# Patient Record
Sex: Female | Born: 1960 | Race: Black or African American | Hispanic: No | Marital: Married
Health system: Southern US, Community
[De-identification: ages and names within clinical notes are randomized; demographics above are authoritative.]

## PROBLEM LIST (undated history)

## (undated) DIAGNOSIS — R7303 Prediabetes: Secondary | ICD-10-CM

## (undated) DIAGNOSIS — N83202 Unspecified ovarian cyst, left side: Secondary | ICD-10-CM

## (undated) DIAGNOSIS — I10 Essential (primary) hypertension: Secondary | ICD-10-CM

---

## 2005-07-23 ENCOUNTER — Ambulatory Visit: Payer: Self-pay

## 2006-07-27 ENCOUNTER — Ambulatory Visit: Payer: Self-pay

## 2007-11-09 ENCOUNTER — Ambulatory Visit: Payer: Self-pay

## 2008-02-14 ENCOUNTER — Ambulatory Visit: Payer: Self-pay | Admitting: Family Medicine

## 2008-11-28 ENCOUNTER — Ambulatory Visit: Payer: Self-pay | Admitting: Family Medicine

## 2009-01-11 ENCOUNTER — Ambulatory Visit: Payer: Self-pay | Admitting: Family Medicine

## 2009-08-16 ENCOUNTER — Ambulatory Visit: Payer: Self-pay | Admitting: Gastroenterology

## 2010-05-21 ENCOUNTER — Ambulatory Visit: Payer: Self-pay | Admitting: Unknown Physician Specialty

## 2011-06-09 ENCOUNTER — Ambulatory Visit: Payer: Self-pay | Admitting: Unknown Physician Specialty

## 2012-06-09 ENCOUNTER — Ambulatory Visit: Payer: Self-pay | Admitting: Physician Assistant

## 2013-06-12 ENCOUNTER — Ambulatory Visit: Payer: Self-pay | Admitting: Physician Assistant

## 2014-07-26 ENCOUNTER — Ambulatory Visit: Payer: Self-pay | Admitting: Physician Assistant

## 2015-07-24 ENCOUNTER — Other Ambulatory Visit: Payer: Self-pay | Admitting: Physician Assistant

## 2015-07-24 DIAGNOSIS — Z1231 Encounter for screening mammogram for malignant neoplasm of breast: Secondary | ICD-10-CM

## 2015-08-08 ENCOUNTER — Ambulatory Visit
Admission: RE | Admit: 2015-08-08 | Discharge: 2015-08-08 | Disposition: A | Discharge status: Home / Self Care | Payer: Managed Care, Other (non HMO) | Source: Ambulatory Visit | Attending: Physician Assistant | Admitting: Physician Assistant

## 2015-08-08 DIAGNOSIS — Z1231 Encounter for screening mammogram for malignant neoplasm of breast: Secondary | ICD-10-CM | POA: Insufficient documentation

## 2016-07-20 ENCOUNTER — Other Ambulatory Visit: Payer: Self-pay | Admitting: Physician Assistant

## 2016-07-20 DIAGNOSIS — Z1231 Encounter for screening mammogram for malignant neoplasm of breast: Secondary | ICD-10-CM

## 2016-08-11 ENCOUNTER — Ambulatory Visit
Admission: RE | Admit: 2016-08-11 | Discharge: 2016-08-11 | Disposition: A | Discharge status: Home / Self Care | Payer: Managed Care, Other (non HMO) | Source: Ambulatory Visit | Attending: Physician Assistant | Admitting: Physician Assistant

## 2016-08-11 DIAGNOSIS — Z1231 Encounter for screening mammogram for malignant neoplasm of breast: Secondary | ICD-10-CM | POA: Diagnosis not present

## 2017-06-24 ENCOUNTER — Other Ambulatory Visit: Payer: Self-pay | Admitting: Physician Assistant

## 2017-06-24 DIAGNOSIS — Z1231 Encounter for screening mammogram for malignant neoplasm of breast: Secondary | ICD-10-CM

## 2017-08-13 ENCOUNTER — Inpatient Hospital Stay: Admission: RE | Admit: 2017-08-13 | Payer: Managed Care, Other (non HMO) | Source: Ambulatory Visit

## 2017-08-30 ENCOUNTER — Ambulatory Visit
Admission: RE | Admit: 2017-08-30 | Discharge: 2017-08-30 | Disposition: A | Discharge status: Home / Self Care | Payer: Managed Care, Other (non HMO) | Source: Ambulatory Visit | Attending: Physician Assistant | Admitting: Physician Assistant

## 2017-08-30 DIAGNOSIS — Z1231 Encounter for screening mammogram for malignant neoplasm of breast: Secondary | ICD-10-CM

## 2017-12-22 DIAGNOSIS — Z124 Encounter for screening for malignant neoplasm of cervix: Secondary | ICD-10-CM | POA: Diagnosis not present

## 2017-12-22 DIAGNOSIS — Z0000 Encounter for general adult medical examination without abnormal findings: Secondary | ICD-10-CM | POA: Diagnosis not present

## 2017-12-22 DIAGNOSIS — I10 Essential (primary) hypertension: Secondary | ICD-10-CM | POA: Diagnosis not present

## 2017-12-22 DIAGNOSIS — R7303 Prediabetes: Secondary | ICD-10-CM | POA: Diagnosis not present

## 2018-06-15 DIAGNOSIS — T783XXA Angioneurotic edema, initial encounter: Secondary | ICD-10-CM | POA: Diagnosis not present

## 2018-07-01 DIAGNOSIS — I10 Essential (primary) hypertension: Secondary | ICD-10-CM | POA: Diagnosis not present

## 2018-07-01 DIAGNOSIS — R7303 Prediabetes: Secondary | ICD-10-CM | POA: Diagnosis not present

## 2018-07-07 DIAGNOSIS — I10 Essential (primary) hypertension: Secondary | ICD-10-CM | POA: Diagnosis not present

## 2018-07-07 DIAGNOSIS — K5909 Other constipation: Secondary | ICD-10-CM | POA: Diagnosis not present

## 2018-07-07 DIAGNOSIS — R7303 Prediabetes: Secondary | ICD-10-CM | POA: Diagnosis not present

## 2018-07-26 ENCOUNTER — Other Ambulatory Visit: Payer: Self-pay | Admitting: Physician Assistant

## 2018-07-26 DIAGNOSIS — T783XXA Angioneurotic edema, initial encounter: Secondary | ICD-10-CM | POA: Diagnosis not present

## 2018-07-26 DIAGNOSIS — Z1231 Encounter for screening mammogram for malignant neoplasm of breast: Secondary | ICD-10-CM

## 2018-07-26 DIAGNOSIS — J301 Allergic rhinitis due to pollen: Secondary | ICD-10-CM | POA: Diagnosis not present

## 2018-07-26 DIAGNOSIS — J3089 Other allergic rhinitis: Secondary | ICD-10-CM | POA: Diagnosis not present

## 2018-09-09 ENCOUNTER — Ambulatory Visit
Admission: RE | Admit: 2018-09-09 | Discharge: 2018-09-09 | Disposition: A | Discharge status: Home / Self Care | Payer: 59 | Source: Ambulatory Visit | Attending: Physician Assistant | Admitting: Physician Assistant

## 2018-09-09 DIAGNOSIS — Z1231 Encounter for screening mammogram for malignant neoplasm of breast: Secondary | ICD-10-CM | POA: Insufficient documentation

## 2018-09-30 DIAGNOSIS — H40033 Anatomical narrow angle, bilateral: Secondary | ICD-10-CM | POA: Diagnosis not present

## 2018-10-03 DIAGNOSIS — H40033 Anatomical narrow angle, bilateral: Secondary | ICD-10-CM | POA: Diagnosis not present

## 2018-12-16 DIAGNOSIS — I10 Essential (primary) hypertension: Secondary | ICD-10-CM | POA: Diagnosis not present

## 2018-12-16 DIAGNOSIS — R7303 Prediabetes: Secondary | ICD-10-CM | POA: Diagnosis not present

## 2018-12-26 DIAGNOSIS — I10 Essential (primary) hypertension: Secondary | ICD-10-CM | POA: Diagnosis not present

## 2018-12-26 DIAGNOSIS — Z0000 Encounter for general adult medical examination without abnormal findings: Secondary | ICD-10-CM | POA: Diagnosis not present

## 2018-12-26 DIAGNOSIS — R7303 Prediabetes: Secondary | ICD-10-CM | POA: Diagnosis not present

## 2018-12-28 ENCOUNTER — Other Ambulatory Visit: Payer: Self-pay | Admitting: Physician Assistant

## 2018-12-28 DIAGNOSIS — R7989 Other specified abnormal findings of blood chemistry: Secondary | ICD-10-CM

## 2018-12-28 DIAGNOSIS — Z0000 Encounter for general adult medical examination without abnormal findings: Secondary | ICD-10-CM

## 2018-12-28 DIAGNOSIS — R945 Abnormal results of liver function studies: Principal | ICD-10-CM

## 2019-01-04 ENCOUNTER — Telehealth: Payer: Self-pay | Admitting: Physician Assistant

## 2019-01-05 ENCOUNTER — Ambulatory Visit: Payer: 59

## 2019-02-10 ENCOUNTER — Ambulatory Visit: Payer: 59

## 2019-02-15 ENCOUNTER — Ambulatory Visit
Admission: RE | Admit: 2019-02-15 | Discharge: 2019-02-15 | Disposition: A | Discharge status: Home / Self Care | Payer: 59 | Source: Ambulatory Visit | Attending: Physician Assistant | Admitting: Physician Assistant

## 2019-02-15 ENCOUNTER — Other Ambulatory Visit: Payer: Self-pay

## 2019-02-15 DIAGNOSIS — Z0000 Encounter for general adult medical examination without abnormal findings: Secondary | ICD-10-CM | POA: Diagnosis not present

## 2019-02-15 DIAGNOSIS — R945 Abnormal results of liver function studies: Secondary | ICD-10-CM | POA: Insufficient documentation

## 2019-02-15 DIAGNOSIS — R7989 Other specified abnormal findings of blood chemistry: Secondary | ICD-10-CM

## 2019-03-01 IMAGING — MG 2D DIGITAL SCREENING BILATERAL MAMMOGRAM WITH CAD AND ADJUNCT TO
9 of 13 series · 9 of 29 positions shown · non-contrast
Comparison: Previous exam(s).

CLINICAL DATA: Screening.

EXAM:
2D DIGITAL SCREENING BILATERAL MAMMOGRAM WITH CAD AND ADJUNCT TOMO

[R MLO (1 of 2)]
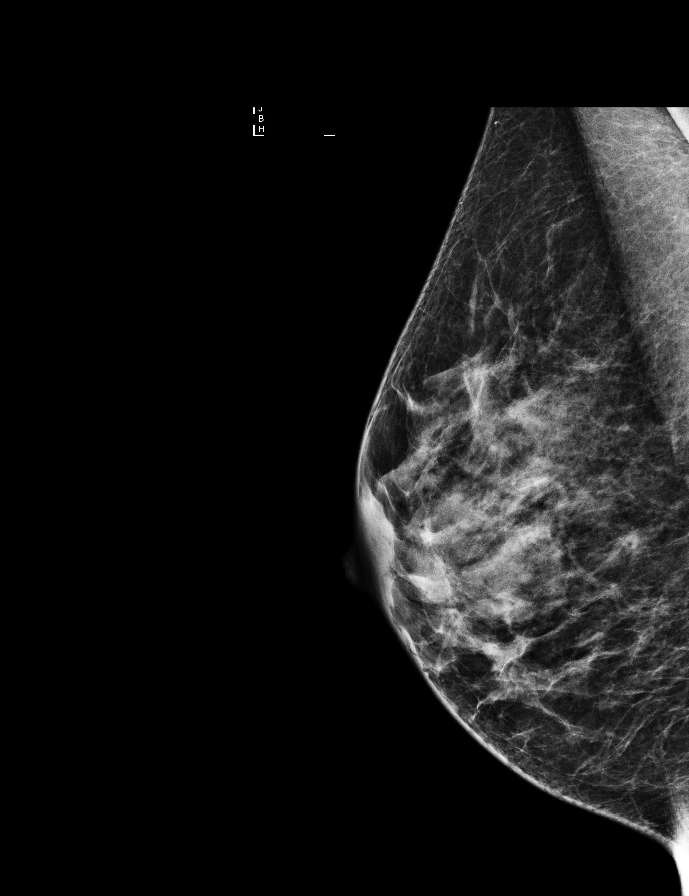

[R MLO (2 of 2)]
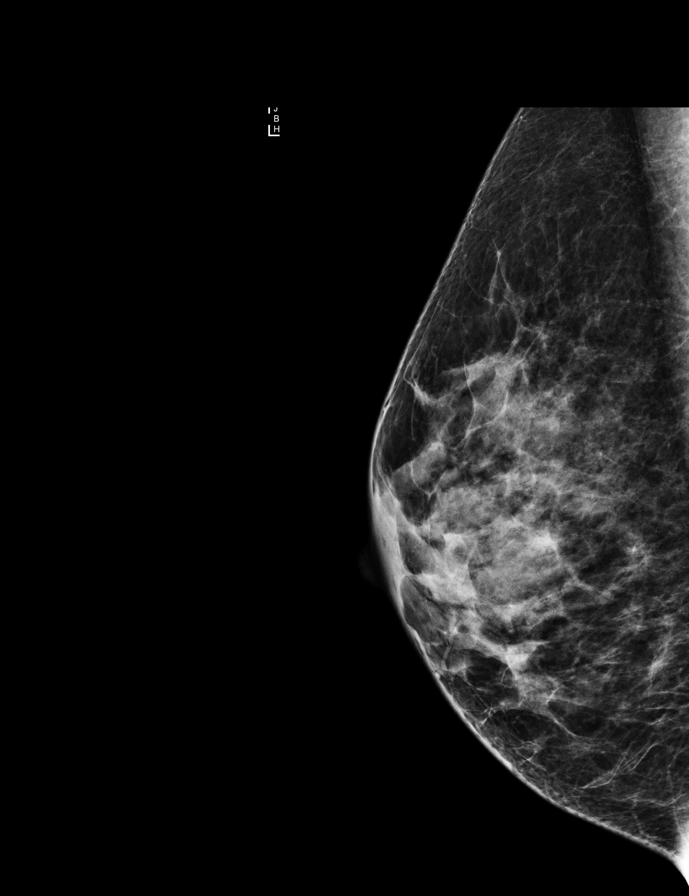

[L CC]
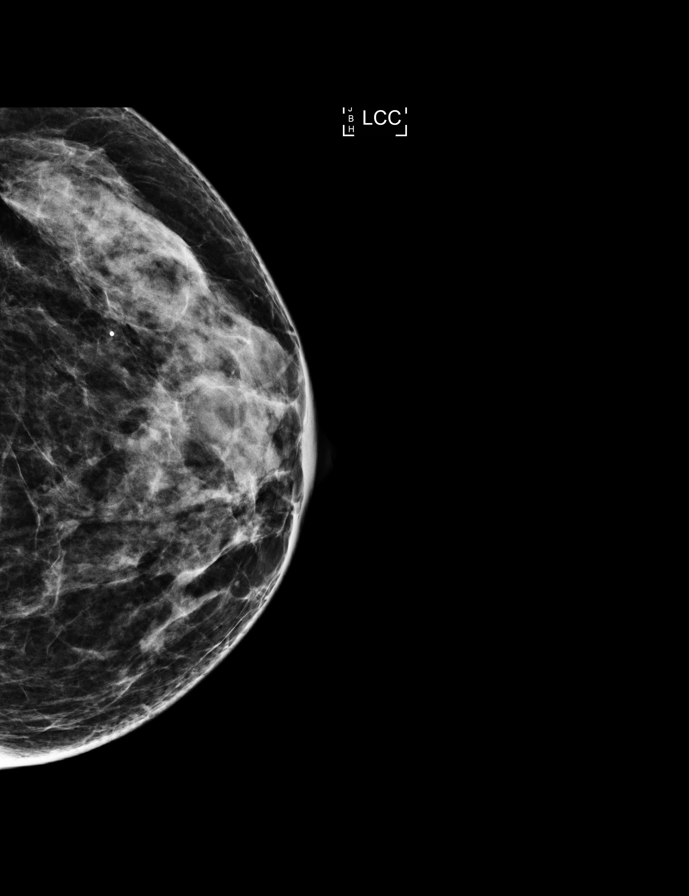

[R CC]
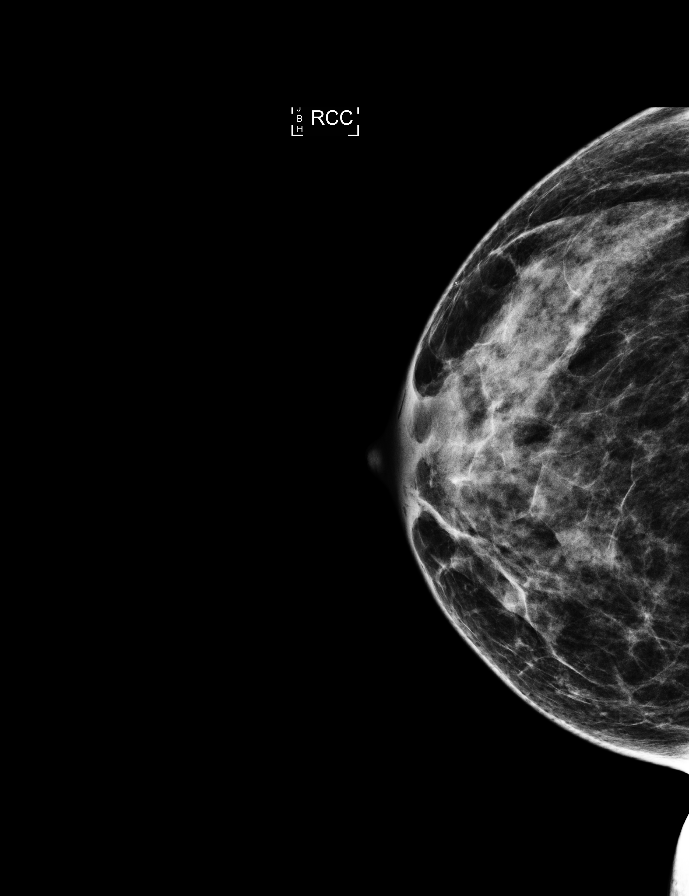

[R MLO synth-2D]
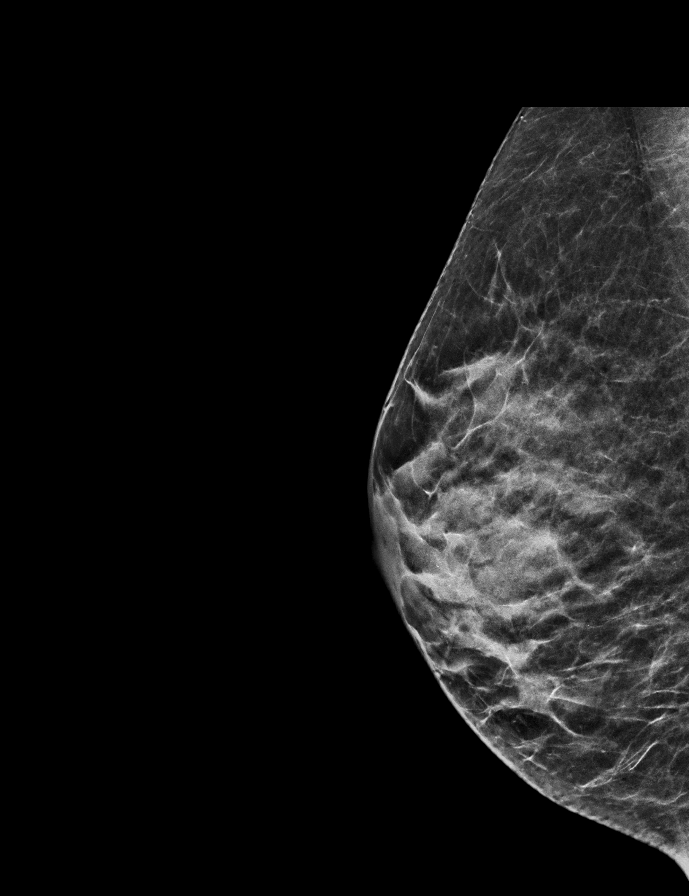

[L MLO synth-2D]
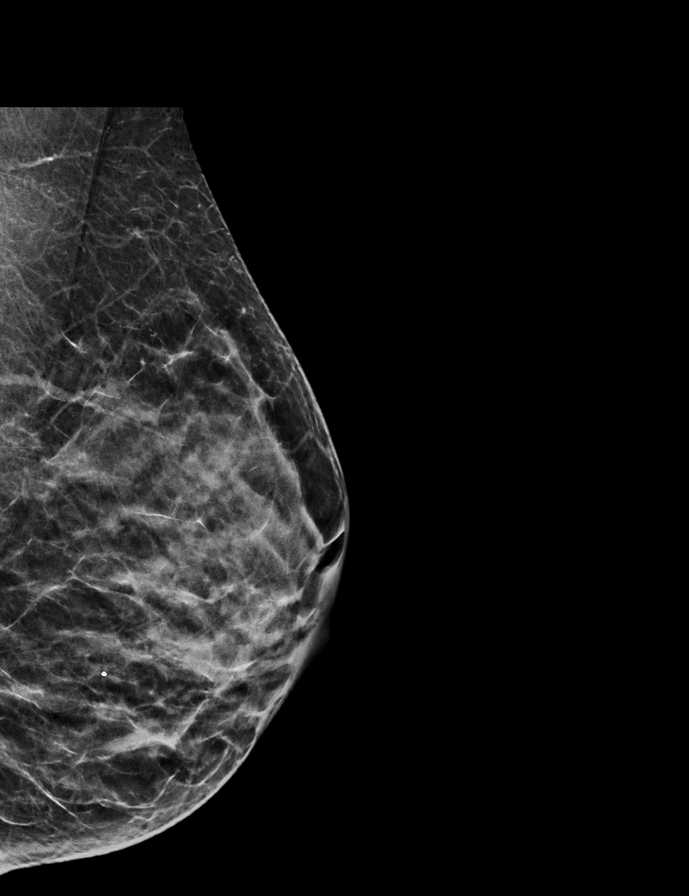

[L CC synth-2D]
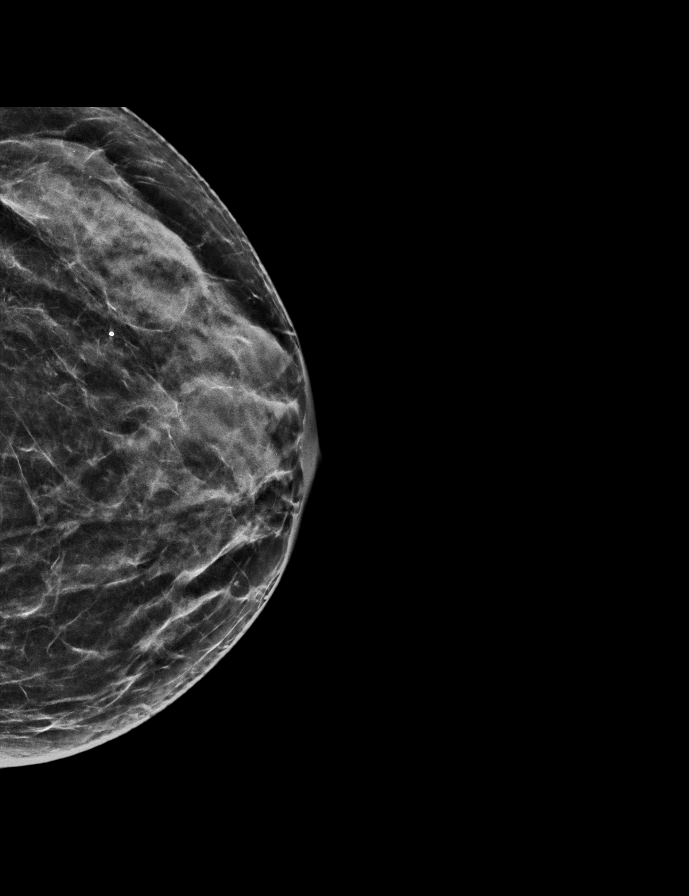

[L MLO]
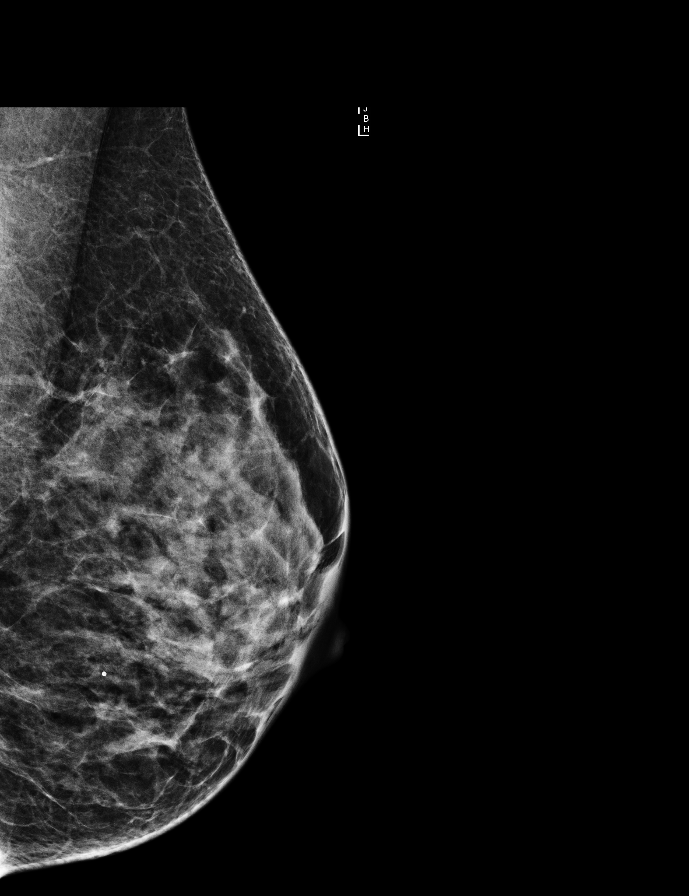

[R CC synth-2D]
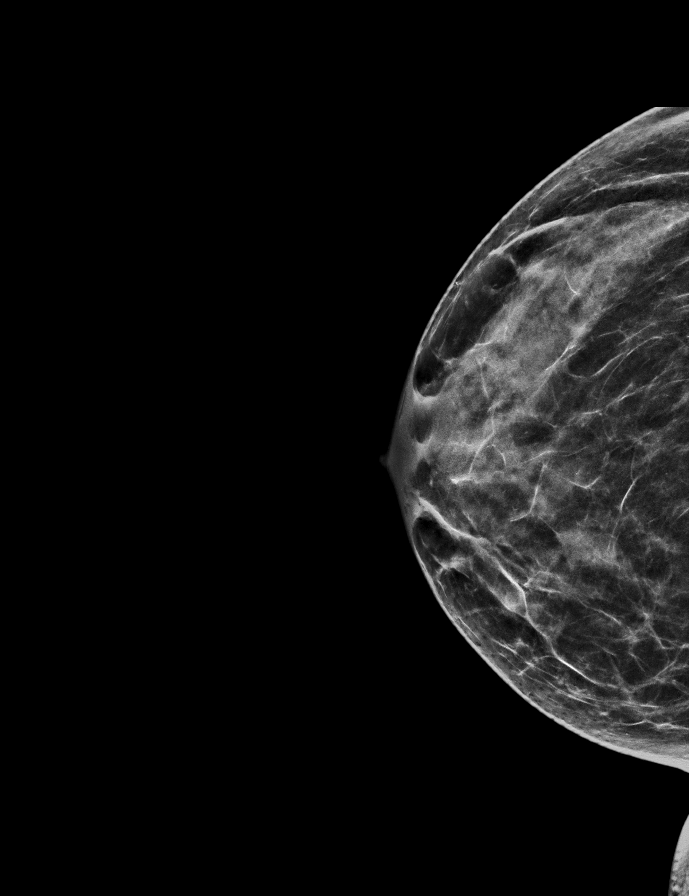

[9 of 29 positions shown; findings below may reference images not displayed]

ACR Breast Density Category c: The breast tissue is heterogeneously
dense, which may obscure small masses.
FINDINGS: There are no findings suspicious for malignancy. Images were
processed with CAD.
IMPRESSION: No mammographic evidence of malignancy. A result letter of this
screening mammogram will be mailed directly to the patient.

RECOMMENDATION:
Screening mammogram in one year. (Code:[id number])

BI-RADS CATEGORY  1: Negative.

## 2019-08-04 ENCOUNTER — Other Ambulatory Visit: Payer: Self-pay | Admitting: Physician Assistant

## 2019-08-04 DIAGNOSIS — Z1231 Encounter for screening mammogram for malignant neoplasm of breast: Secondary | ICD-10-CM

## 2019-09-12 ENCOUNTER — Ambulatory Visit
Admission: RE | Admit: 2019-09-12 | Discharge: 2019-09-12 | Disposition: A | Discharge status: Home / Self Care | Payer: 59 | Source: Ambulatory Visit | Attending: Physician Assistant | Admitting: Physician Assistant

## 2019-09-12 DIAGNOSIS — Z1231 Encounter for screening mammogram for malignant neoplasm of breast: Secondary | ICD-10-CM | POA: Diagnosis present

## 2020-06-26 ENCOUNTER — Other Ambulatory Visit: Payer: 59

## 2020-06-26 ENCOUNTER — Other Ambulatory Visit: Payer: Self-pay

## 2020-06-26 DIAGNOSIS — Z20822 Contact with and (suspected) exposure to covid-19: Secondary | ICD-10-CM

## 2020-06-28 ENCOUNTER — Telehealth: Payer: Self-pay

## 2020-06-28 LAB — SARS-COV-2, NAA 2 DAY TAT

## 2020-06-28 LAB — NOVEL CORONAVIRUS, NAA: SARS-CoV-2, NAA: NOT DETECTED

## 2020-06-28 NOTE — Telephone Encounter (Signed)
Patient called and she was informed that her test for COVID-19 was still pending result.  She verbalized understanding and will call back.

## 2020-06-30 ENCOUNTER — Telehealth: Payer: Self-pay | Admitting: Physician Assistant

## 2020-06-30 NOTE — Telephone Encounter (Signed)
Spoke with pt who was exposed to covid 19, nine days ago. It was an infant who exposed her. Pt is asymptomatic and "feels good". Pt tested negative for covid.  Pt asking if she can come out of quarantine. Advised pt  to monitor sx and get retested if having sx.  Advised pt to wash hands, wear mask and stay 6 feet apart and not touch her face. Pt verbalized understanding.

## 2020-06-30 NOTE — Telephone Encounter (Signed)
Pt was exposed to someone who tested positive for covid 19 about 8 or 9 day ago. Pt had covid test and result was neg. Pt would like to know if she needs to still in quarantine. Please call cell number [phone] if unable to reach on home phone

## 2020-07-26 ENCOUNTER — Other Ambulatory Visit: Payer: Self-pay | Admitting: Physician Assistant

## 2020-07-26 DIAGNOSIS — Z1231 Encounter for screening mammogram for malignant neoplasm of breast: Secondary | ICD-10-CM

## 2020-09-16 ENCOUNTER — Other Ambulatory Visit: Payer: Self-pay

## 2020-09-16 ENCOUNTER — Ambulatory Visit
Admission: RE | Admit: 2020-09-16 | Discharge: 2020-09-16 | Disposition: A | Discharge status: Home / Self Care | Payer: 59 | Source: Ambulatory Visit | Attending: Physician Assistant | Admitting: Physician Assistant

## 2020-09-16 DIAGNOSIS — Z1231 Encounter for screening mammogram for malignant neoplasm of breast: Secondary | ICD-10-CM | POA: Insufficient documentation

## 2021-08-04 ENCOUNTER — Other Ambulatory Visit: Payer: Self-pay | Admitting: Physician Assistant

## 2021-08-04 DIAGNOSIS — Z1231 Encounter for screening mammogram for malignant neoplasm of breast: Secondary | ICD-10-CM

## 2021-09-17 ENCOUNTER — Ambulatory Visit
Admission: RE | Admit: 2021-09-17 | Discharge: 2021-09-17 | Disposition: A | Discharge status: Home / Self Care | Payer: BC Managed Care – PPO | Source: Ambulatory Visit | Attending: Physician Assistant | Admitting: Physician Assistant

## 2021-09-17 ENCOUNTER — Other Ambulatory Visit: Payer: Self-pay

## 2021-09-17 DIAGNOSIS — Z1231 Encounter for screening mammogram for malignant neoplasm of breast: Secondary | ICD-10-CM | POA: Insufficient documentation

## 2021-09-25 ENCOUNTER — Emergency Department
Admission: EM | Admit: 2021-09-25 | Discharge: 2021-09-26 | Disposition: A | Discharge status: Home / Self Care | Payer: BC Managed Care – PPO | Attending: Emergency Medicine | Admitting: Emergency Medicine

## 2021-09-25 ENCOUNTER — Other Ambulatory Visit: Payer: Self-pay

## 2021-09-25 DIAGNOSIS — R310 Gross hematuria: Secondary | ICD-10-CM | POA: Insufficient documentation

## 2021-09-25 DIAGNOSIS — R109 Unspecified abdominal pain: Secondary | ICD-10-CM | POA: Insufficient documentation

## 2021-09-25 DIAGNOSIS — R8271 Bacteriuria: Secondary | ICD-10-CM | POA: Diagnosis not present

## 2021-09-25 DIAGNOSIS — I10 Essential (primary) hypertension: Secondary | ICD-10-CM | POA: Diagnosis not present

## 2021-09-25 HISTORY — DX: Essential (primary) hypertension: I10

## 2021-09-25 LAB — URINALYSIS, ROUTINE W REFLEX MICROSCOPIC
Bilirubin Urine: NEGATIVE
Glucose, UA: NEGATIVE mg/dL
Ketones, ur: NEGATIVE mg/dL
Leukocytes,Ua: NEGATIVE
Nitrite: NEGATIVE
Protein, ur: NEGATIVE mg/dL
Specific Gravity, Urine: <1.005 — ABNORMAL LOW (ref 1.005–1.030)
Squamous Epithelial / HPF: NONE SEEN (ref 0–5)
pH: 6.5 (ref 5.0–8.0)

## 2021-09-25 LAB — BASIC METABOLIC PANEL
Anion gap: 8 (ref 5–15)
BUN: 8 mg/dL (ref 8–23)
CO2: 27 mmol/L (ref 22–32)
Calcium: 9.7 mg/dL (ref 8.9–10.3)
Chloride: 104 mmol/L (ref 98–111)
Creatinine, Ser: 0.67 mg/dL (ref 0.44–1.00)
GFR, Estimated: >60 mL/min (ref >60)
Glucose, Bld: 119 mg/dL — ABNORMAL HIGH (ref 70–99)
Potassium: 3.8 mmol/L (ref 3.5–5.1)
Sodium: 139 mmol/L (ref 135–145)

## 2021-09-25 LAB — CBC
HCT: 38.8 % (ref 36.0–46.0)
Hemoglobin: 12.9 g/dL (ref 12.0–15.0)
MCH: 29.3 pg (ref 26.0–34.0)
MCHC: 33.2 g/dL (ref 30.0–36.0)
MCV: 88 fL (ref 80.0–100.0)
Platelets: 300 10*3/uL (ref 150–400)
RBC: 4.41 MIL/uL (ref 3.87–5.11)
RDW: 12.9 % (ref 11.5–15.5)
WBC: 7.1 10*3/uL (ref 4.0–10.5)
nRBC: 0 % (ref 0.0–0.2)

## 2021-09-25 NOTE — ED Triage Notes (Signed)
Pt states she noticed a couple hours ago that there was blood in her urine- pt denies in dysuria, but does have lower back pain- pt states there was a medium amount blood in the toilet

## 2021-09-25 NOTE — ED Provider Notes (Signed)
[facility] Emergency Department Provider Note  ____________________________________________   Event Date/Time   First MD Initiated Contact with Patient 09/25/21 2331     (approximate)  I have reviewed the triage vital signs and the nursing notes.   HISTORY  Chief Complaint Hematuria    HPI MARKELLA DAO is a 60 y.o. female here with hematuria.  The patient states that earlier this afternoon, she went to urinate and noticed that there was a small amount of blood in her urine.  She believes this came out of her urine and not vagina or bowel movement.  She states that since then, she has had 2 recurrent issues in which her urine has appeared bloody.  She denies any associated nausea or vomiting.  Denies any flank pain.  She does some mild lower back pain which is now resolved.  Denies history of UTIs, stones, or other issues similar to this.  No recent medication changes.  No recent falls or trauma.  No fever or chills.  No other complaints.    Past Medical History:  Diagnosis Date   Hypertension     There are no problems to display for this patient.   History reviewed. No pertinent surgical history.  Prior to Admission medications   Medication Sig Start Date End Date Taking? Authorizing Provider  cephALEXin (KEFLEX) 500 MG capsule Take 1 capsule (500 mg total) by mouth 3 (three) times daily for 5 days. 09/26/21 10/01/21 Yes Shaune Pollack, MD    Allergies Patient has no known allergies.  Family History  Problem Relation Age of Onset   Breast cancer Paternal Aunt    Breast cancer Cousin        distant maternal cousin    Social History    Review of Systems  Review of Systems  Constitutional:  Negative for fever.  HENT:  Negative for congestion and sore throat.   Eyes:  Negative for visual disturbance.  Respiratory:  Negative for cough and shortness of breath.   Cardiovascular:  Negative for chest pain.  Gastrointestinal:  Negative  for abdominal pain, diarrhea, nausea and vomiting.  Genitourinary:  Positive for hematuria. Negative for flank pain.  Musculoskeletal:  Negative for back pain (Mild, transient) and neck pain.  Skin:  Negative for rash and wound.  Neurological:  Negative for weakness.    ____________________________________________  PHYSICAL EXAM:      VITAL SIGNS: ED Triage Vitals  Enc Vitals Group     BP 09/25/21 1954 (!) 160/89     Pulse Rate 09/25/21 1954 80     Resp 09/25/21 1954 18     Temp 09/25/21 1954 98.2 F (36.8 C)     Temp Source 09/25/21 1954 Oral     SpO2 09/25/21 1954 100 %     Weight 09/25/21 1955 120 lb (54.4 kg)     Height 09/25/21 1955 5\' 1"  (1.549 m)     Head Circumference --      Peak Flow --      Pain Score 09/25/21 1955 3     Pain Loc --      Pain Edu? --      Excl. in GC? --      Physical Exam Vitals and nursing note reviewed.  Constitutional:      General: She is not in acute distress.    Appearance: She is well-developed.  HENT:     Head: Normocephalic and atraumatic.  Eyes:     Conjunctiva/sclera: Conjunctivae normal.  Cardiovascular:     Rate and Rhythm: Normal rate and regular rhythm.     Heart sounds: Normal heart sounds. No murmur heard.   No friction rub.  Pulmonary:     Effort: Pulmonary effort is normal. No respiratory distress.     Breath sounds: Normal breath sounds. No wheezing or rales.  Abdominal:     General: There is no distension.     Palpations: Abdomen is soft.     Tenderness: There is no abdominal tenderness.     Comments: Minimal tenderness of her right flank.  No overt CVA tenderness.  Musculoskeletal:     Cervical back: Neck supple.  Skin:    General: Skin is warm.     Capillary Refill: Capillary refill takes less than 2 seconds.  Neurological:     Mental Status: She is alert and oriented to person, place, and time.     Motor: No abnormal muscle tone.      ____________________________________________   LABS (all labs  ordered are listed, but only abnormal results are displayed)  Labs Reviewed  URINALYSIS, ROUTINE W REFLEX MICROSCOPIC - Abnormal; Notable for the following components:      Result Value   APPearance CLEAR (*)    Specific Gravity, Urine <1.005 (*)    Hgb urine dipstick LARGE (*)    Bacteria, UA RARE (*)    All other components within normal limits  BASIC METABOLIC PANEL - Abnormal; Notable for the following components:   Glucose, Bld 119 (*)    All other components within normal limits  CBC  CK    ____________________________________________  EKG:  ________________________________________  RADIOLOGY All imaging, including plain films, CT scans, and ultrasounds, independently reviewed by me, and interpretations confirmed via formal radiology reads.  ED MD interpretation:     Official radiology report(s): CT Renal Stone Study  Result Date: 09/26/2021 CLINICAL DATA:  Flank pain and hematuria EXAM: CT ABDOMEN AND PELVIS WITHOUT CONTRAST TECHNIQUE: Multidetector CT imaging of the abdomen and pelvis was performed following the standard protocol without IV contrast. COMPARISON:  None. FINDINGS: Lower chest: No acute abnormality. Hepatobiliary: No focal liver abnormality is seen. No gallstones, gallbladder wall thickening, or biliary dilatation. Pancreas: Unremarkable. No pancreatic ductal dilatation or surrounding inflammatory changes. Spleen: Normal in size without focal abnormality. Adrenals/Urinary Tract: Adrenal glands are within normal limits bilaterally. Kidneys demonstrate no renal calculi or urinary tract obstructive changes. Ureters are within normal limits. The bladder is well distended. Stomach/Bowel: No obstructive or inflammatory changes of the colon are seen. The appendix is air-filled and within normal limits. Small bowel and stomach are unremarkable. Vascular/Lymphatic: Aortic atherosclerosis. No enlarged abdominal or pelvic lymph nodes. Reproductive: Uterus and bilateral  adnexa are unremarkable. Other: No abdominal wall hernia or abnormality. No abdominopelvic ascites. Musculoskeletal: No acute or significant osseous findings. IMPRESSION: No acute abnormality noted. No findings to correspond with the given clinical history. Electronically Signed   By: Inez Catalina M.D.   On: 09/26/2021 01:08    ____________________________________________  PROCEDURES   Procedure(s) performed (including Critical Care):  Procedures  ____________________________________________  INITIAL IMPRESSION / MDM / [place] / ED COURSE  As part of my medical decision making, I reviewed the following data within the [place] notes reviewed and incorporated, Old chart reviewed, Notes from prior ED visits, and [place] Controlled Substance Database       *HERA DEXHEIMER was evaluated in Emergency Department on 09/26/2021 for the symptoms described in the history  of present illness. She was evaluated in the context of the global COVID-19 pandemic, which necessitated consideration that the patient might be at risk for infection with the SARS-CoV-2 virus that causes COVID-19. Institutional protocols and algorithms that pertain to the evaluation of patients at risk for COVID-19 are in a state of rapid change based on information released by regulatory bodies including the CDC and federal and state organizations. These policies and algorithms were followed during the patient's care in the ED.  Some ED evaluations and interventions may be delayed as a result of limited staffing during the pandemic.*     Medical Decision Making:  60 yo F here with transient hematuria, improving. No flank pain. Pt is HDS and well appearing. UA does show +blood, bacteriuria. BMP with normal renal function. CBC without anemia or leukocytosis. CT stone study negative. Bleeding is in urine, denies any vaginal bleeding or spotting. No blood in BMs. Suspect possible UTI, though will  refer to PCP for repeat UA to confirm no persistent hematuria/need for Urology referral. Will tx empirically. [place] sent. Otherwise, no apparent emergent pathology. No evidence of stone, AAA, or other etiology.   ____________________________________________  FINAL CLINICAL IMPRESSION(S) / ED DIAGNOSES  Final diagnoses:  Gross hematuria     MEDICATIONS GIVEN DURING THIS VISIT:  Medications - No data to display   ED Discharge Orders          Ordered    cephALEXin (KEFLEX) 500 MG capsule  3 times daily        09/26/21 0120             Note:  This document was prepared using Dragon voice recognition software and may include unintentional dictation errors.   Duffy Bruce, MD 09/26/21 0120

## 2021-09-26 ENCOUNTER — Emergency Department: Payer: BC Managed Care – PPO

## 2021-09-26 LAB — CK: Total CK: 132 U/L (ref 38–234)

## 2021-09-26 MED ORDER — CEPHALEXIN 500 MG PO CAPS: 500.0000 mg | ORAL_CAPSULE | Freq: Three times a day (TID) | ORAL | 0 refills | Status: AC

## 2021-09-26 NOTE — ED Notes (Signed)
Pt signed paper copy of discharge 

## 2021-09-26 NOTE — ED Notes (Signed)
Patient transported to CT 

## 2021-09-27 LAB — URINE CULTURE: Culture: 10000 — AB

## 2022-07-01 ENCOUNTER — Other Ambulatory Visit: Payer: Self-pay | Admitting: Physician Assistant

## 2022-07-01 DIAGNOSIS — Z1231 Encounter for screening mammogram for malignant neoplasm of breast: Secondary | ICD-10-CM

## 2022-09-21 ENCOUNTER — Ambulatory Visit
Admission: RE | Admit: 2022-09-21 | Discharge: 2022-09-21 | Disposition: A | Discharge status: Home / Self Care | Payer: BC Managed Care – PPO | Source: Ambulatory Visit | Attending: Physician Assistant | Admitting: Physician Assistant

## 2022-09-21 DIAGNOSIS — Z1231 Encounter for screening mammogram for malignant neoplasm of breast: Secondary | ICD-10-CM | POA: Insufficient documentation

## 2022-10-28 ENCOUNTER — Emergency Department (HOSPITAL_COMMUNITY): Payer: BC Managed Care – PPO

## 2022-10-28 ENCOUNTER — Emergency Department (HOSPITAL_COMMUNITY)
Admission: EM | Admit: 2022-10-28 | Discharge: 2022-10-28 | Disposition: A | Discharge status: Home / Self Care | Payer: BC Managed Care – PPO | Attending: Emergency Medicine | Admitting: Emergency Medicine

## 2022-10-28 ENCOUNTER — Encounter (HOSPITAL_COMMUNITY): Payer: Self-pay

## 2022-10-28 ENCOUNTER — Other Ambulatory Visit: Payer: Self-pay

## 2022-10-28 DIAGNOSIS — M25512 Pain in left shoulder: Secondary | ICD-10-CM | POA: Insufficient documentation

## 2022-10-28 DIAGNOSIS — Y92410 Unspecified street and highway as the place of occurrence of the external cause: Secondary | ICD-10-CM | POA: Diagnosis not present

## 2022-10-28 DIAGNOSIS — M542 Cervicalgia: Secondary | ICD-10-CM | POA: Insufficient documentation

## 2022-10-28 DIAGNOSIS — M25562 Pain in left knee: Secondary | ICD-10-CM | POA: Diagnosis not present

## 2022-10-28 DIAGNOSIS — R079 Chest pain, unspecified: Secondary | ICD-10-CM | POA: Insufficient documentation

## 2022-10-28 DIAGNOSIS — M25552 Pain in left hip: Secondary | ICD-10-CM | POA: Diagnosis not present

## 2022-10-28 MED ORDER — METOCLOPRAMIDE HCL 5 MG/ML IJ SOLN
10.0000 mg | Freq: Once | INTRAMUSCULAR | Status: AC
  Administered 2022-10-28: 10 mg via INTRAVENOUS
  Filled 2022-10-28: qty 2

## 2022-10-28 MED ORDER — TIZANIDINE HCL 2 MG PO CAPS: 2.0000 mg | ORAL_CAPSULE | Freq: Three times a day (TID) | ORAL | 0 refills | Status: DC

## 2022-10-28 MED ORDER — PREDNISONE 20 MG PO TABS: 40.0000 mg | ORAL_TABLET | Freq: Every day | ORAL | 0 refills | Status: DC

## 2022-10-28 MED ORDER — KETOROLAC TROMETHAMINE 30 MG/ML IJ SOLN
15.0000 mg | Freq: Once | INTRAMUSCULAR | Status: AC
  Administered 2022-10-28: 15 mg via INTRAVENOUS
  Filled 2022-10-28: qty 1

## 2022-10-28 MED ORDER — SODIUM CHLORIDE 0.9 % IV BOLUS
1000.0000 mL | Freq: Once | INTRAVENOUS | Status: AC
  Administered 2022-10-28: 1000 mL via INTRAVENOUS

## 2022-10-28 MED ORDER — LORAZEPAM 2 MG/ML IJ SOLN
0.5000 mg | Freq: Once | INTRAMUSCULAR | Status: AC
  Administered 2022-10-28: 0.5 mg via INTRAVENOUS
  Filled 2022-10-28: qty 1

## 2022-10-28 MED ORDER — ACETAMINOPHEN 500 MG PO TABS
1000.0000 mg | ORAL_TABLET | Freq: Once | ORAL | Status: AC
  Administered 2022-10-28: 1000 mg via ORAL
  Filled 2022-10-28: qty 2

## 2022-10-28 NOTE — ED Notes (Signed)
Patient resting in bed with family at bedside. Patient medicated. Family asked for water RN informed not until scans are complete.
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
## 2022-10-28 NOTE — ED Notes (Signed)
Patient discharged home with son. Denies any other needs at this time. Informed RN that son will be staying with her tonight.
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
## 2022-10-28 NOTE — ED Notes (Signed)
Patient transported to XR. 

## 2022-10-28 NOTE — ED Notes (Signed)
Patient transported to CT 
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
## 2022-11-30 ENCOUNTER — Ambulatory Visit
Admission: RE | Admit: 2022-11-30 | Discharge: 2022-11-30 | Disposition: A | Discharge status: Home / Self Care | Payer: BC Managed Care – PPO | Source: Ambulatory Visit | Attending: Family Medicine | Admitting: Family Medicine

## 2022-11-30 ENCOUNTER — Other Ambulatory Visit: Payer: Self-pay | Admitting: Family Medicine

## 2022-11-30 DIAGNOSIS — R0789 Other chest pain: Secondary | ICD-10-CM

## 2022-12-01 ENCOUNTER — Ambulatory Visit
Admission: RE | Admit: 2022-12-01 | Discharge: 2022-12-01 | Disposition: A | Discharge status: Home / Self Care | Payer: BC Managed Care – PPO | Source: Ambulatory Visit | Attending: Family Medicine | Admitting: Family Medicine

## 2023-02-25 ENCOUNTER — Other Ambulatory Visit: Payer: Self-pay | Admitting: Sports Medicine

## 2023-02-25 DIAGNOSIS — M79605 Pain in left leg: Secondary | ICD-10-CM

## 2023-02-25 DIAGNOSIS — M5136 Other intervertebral disc degeneration, lumbar region: Secondary | ICD-10-CM

## 2023-03-13 ENCOUNTER — Ambulatory Visit
Admission: RE | Admit: 2023-03-13 | Discharge: 2023-03-13 | Disposition: A | Discharge status: Home / Self Care | Payer: BC Managed Care – PPO | Source: Ambulatory Visit | Attending: Sports Medicine | Admitting: Sports Medicine

## 2023-03-13 DIAGNOSIS — M5136 Other intervertebral disc degeneration, lumbar region: Secondary | ICD-10-CM

## 2023-03-13 DIAGNOSIS — M79605 Pain in left leg: Secondary | ICD-10-CM

## 2023-06-22 ENCOUNTER — Encounter: Payer: Self-pay | Admitting: Pediatrics

## 2023-07-14 ENCOUNTER — Other Ambulatory Visit: Payer: Self-pay | Admitting: Physician Assistant

## 2023-07-14 DIAGNOSIS — Z1231 Encounter for screening mammogram for malignant neoplasm of breast: Secondary | ICD-10-CM

## 2023-07-15 ENCOUNTER — Other Ambulatory Visit: Payer: Self-pay | Admitting: Obstetrics and Gynecology

## 2023-07-15 NOTE — H&P (Signed)
Christy [name] is a 62 y.o. female here for L/S BSO  .pt here for follow up for left ovarian cyst initially seen on MR .  Pt is asymptomatic  repeat u/s today :     Lt ovarian cyst   Results ======   TO VIEW THE FORMAL REPORT IN [facility], OPEN THE PDF (click on Imaging result with paper clip icon, then click link under "Scans on Order"). The plain text version does not include embedded images or graphs. The image link is for reference only. ViewPoint is the official diagnostic image storage site.   Endovaginal Imaging ================   Indication: Endovaginal imaging was necessary to evaluate the uterus and ovaries. Probe: [id number].   Uterus ======   Visualized. Size 65 mm x 42 mm x 30 mm Normal Position: anteverted Malformations: none Myometrium: Heterogeneous Endometrium: appears normal/appropriate. Endometrial thickness, total 3.0 mm Cervix details: nabothian cysts visualized   Right Ovary =========   Visualized, Normal. Outline: Normal. Morphology: Appropriate. Size 18 mm x 10 mm x 11 mm No cysts identified Follicles identified   Left Ovary ========   Visualized, Prominent. Size 41 mm x 38 mm x 23 mm Cyst(s)    1.  Size 34.0 mm x 22.0 mm x 23 mm. Mean 26.3 mm. Vol 9.008 cm. Dermoid/complex cyst        2.  Size 23.0 mm x 14.0 mm x 14 mm. Mean 17.0 mm. Vol 2.360 cm. Simple cyst   Cul de Sac =========   Normal. 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  Past Surgical History:  has a past surgical history that includes colonoscopy (08/16/2009); egd (08/16/2009); Cesarean section; Colonoscopy (12/27/2014); and Colonoscopy (07/12/2020). Family History: family history includes Alcohol abuse in her father; Aneurysm in her mother; High blood pressure (Hypertension) in her brother, father,  mother, sister, son, and another family member; Kidney failure in her maternal grandmother. Social History:  reports that she has never smoked. She has never used smokeless tobacco. She reports that she does not drink alcohol and does not use drugs. OB/GYN History:  OB History       Gravida  1   Para  1   Term      Preterm      AB      Living  1        SAB      IAB      Ectopic      Molar      Multiple      Live Births  1             Allergies: is allergic to enalapril. Medications:  Current Medications    Current Outpatient Medications:    amLODIPine (NORVASC) 10 MG tablet, TAKE 1 TABLET BY MOUTH EVERY DAY, Disp: 90 tablet, Rfl: 1   aspirin 81 MG chewable tablet, Take 81 mg by mouth once daily., Disp: , Rfl:    multivitamin (MULTI-DAY ORAL), Take by mouth 1 daily, Disp: , Rfl:    zolpidem (AMBIEN) 5 MG tablet, Take 1 tablet (5 mg total) by mouth at bedtime as needed for Sleep, Disp: 30 tablet, Rfl: 1   gabapentin (NEURONTIN) 300 MG capsule, Take 1 capsule (300 mg total) by mouth at bedtime for 30 days, Disp: 30 capsule, Rfl: 0     Review of Systems: General:  No fatigue or weight loss Eyes:                           No vision changes Ears:                            No hearing difficulty Respiratory:                No cough or shortness of breath Pulmonary:                  No asthma or shortness of breath Cardiovascular:           No chest pain, palpitations, dyspnea on exertion Gastrointestinal:          No abdominal bloating, chronic diarrhea, constipations, masses, pain or hematochezia Genitourinary:             No hematuria, dysuria, abnormal vaginal discharge, pelvic pain, Menometrorrhagia Lymphatic:                   No swollen lymph nodes Musculoskeletal:No muscle weakness Neurologic:                  No extremity weakness, syncope, seizure disorder Psychiatric:                  No history of depression, delusions or  suicidal/homicidal ideation      Exam:       Vitals:    07/19/23 1510  BP: 133/65  Pulse: 93      Body mass index is 22.86 kg/m.   WDWN / black female in NAD   Lungs: CTA  CV : RRR without murmur     Neck:  no thyromegaly Abdomen: soft , no mass, normal active bowel sounds,  non-tender, no rebound tenderness Pelvic: tanner stage 5 ,  External genitalia: vulva /labia no lesions Urethra: no prolapse Vagina: normal physiologic d/c Cervix: no lesions, no cervical motion tenderness   Uterus: normal size shape and contour, non-tender Adnexa: no mass,  non-tender   Rectovaginal: Impression:    The encounter diagnosis was Left ovarian cyst.   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 She elects for BSO She elects for the the latter   Benefits and risks to surgery: The proposed benefit of the surgery has been discussed with the patient. The possible risks include, but are not limited to: organ injury to the bowel , bladder, ureters, and major blood vessels and nerves. There is a possibility of additional surgeries resulting from these injuries. There is also the risk of blood transfusion and the need to receive blood products during or after the procedure which may rarely lead to HIV or Hepatitis C infection. There is a risk of developing a deep venous thrombosis or a pulmonary embolism . There is the possibility of wound infection and also anesthetic complications, even the rare possibility of death. The patient understands these risks and wishes to proceed.     No follow-ups on file.   Vilma Prader, MD

## 2023-07-28 ENCOUNTER — Encounter
Admission: RE | Admit: 2023-07-28 | Discharge: 2023-07-28 | Disposition: A | Discharge status: Home / Self Care | Payer: BC Managed Care – PPO | Source: Ambulatory Visit | Attending: Obstetrics and Gynecology | Admitting: Obstetrics and Gynecology

## 2023-07-28 VITALS — Ht 64.0 in | Wt 119.9 lb

## 2023-07-28 DIAGNOSIS — I10 Essential (primary) hypertension: Secondary | ICD-10-CM

## 2023-07-28 DIAGNOSIS — Z01810 Encounter for preprocedural cardiovascular examination: Secondary | ICD-10-CM

## 2023-07-28 HISTORY — DX: Unspecified ovarian cyst, left side: N83.202

## 2023-07-28 HISTORY — DX: Prediabetes: R73.03

## 2023-07-28 NOTE — Patient Instructions (Signed)
Your procedure is scheduled on:08-06-23 Friday Report to the Registration Desk on the 1st floor of the [facility].Then proceed to the 2nd floor Surgery Desk To find out your arrival time, please call [phone] between 1PM - 3PM on:08-05-23 Thursday If your arrival time is 6:00 am, do not arrive before that time as the [facility] entrance doors do not open until 6:00 am.  REMEMBER: Instructions that are not followed completely may result in serious medical risk, up to and including death; or upon the discretion of your surgeon and anesthesiologist your surgery may need to be rescheduled.  Do not eat food after midnight the night before surgery.  No gum chewing or hard candies.  You may however, drink CLEAR liquids up to 2 hours before you are scheduled to arrive for your surgery. Do not drink anything within 2 hours of your scheduled arrival time.  Clear liquids include: - water  - apple juice without pulp - gatorade (not RED colors) - black coffee or tea (Do NOT add milk or creamers to the coffee or tea) Do NOT drink anything that is not on this list  In addition, your doctor has ordered for you to drink the provided:  Ensure Pre-Surgery Clear Carbohydrate Drink  Drinking this carbohydrate drink up to two hours before surgery helps to reduce insulin resistance and improve patient outcomes. Please complete drinking 2 hours before scheduled arrival time.  One week prior to surgery:Last dose on 07-29-23 Thursday Stop Anti-inflammatories (NSAIDS) such as Advil, Aleve, Ibuprofen, Motrin, Naproxen, Naprosyn and Aspirin based products such as Excedrin, Goody's Powder, BC Powder. Stop ANY OVER THE COUNTER supplements/vitamins 7 days prior to surgery (Multivitamin)  You may however, continue to take Tylenol if needed for pain up until the day of surgery.  Continue taking all of your other prescription medications up until the day of surgery.  Stop your 81 mg Aspirin 7 days prior to  surgery-Last dose will be on 07-29-23 Thursday  ON THE DAY OF SURGERY ONLY TAKE THESE MEDICATIONS WITH SIPS OF WATER: -amLODipine (NORVASC)   No Alcohol for 24 hours before or after surgery.  No Smoking including e-cigarettes for 24 hours before surgery.  No chewable tobacco products for at least 6 hours before surgery.  No nicotine patches on the day of surgery.  Do not use any "recreational" drugs for at least a week (preferably 2 weeks) before your surgery.  Please be advised that the combination of cocaine and anesthesia may have negative outcomes, up to and including death. If you test positive for cocaine, your surgery will be cancelled.  On the morning of surgery brush your teeth with toothpaste and water, you may rinse your mouth with mouthwash if you wish. Do not swallow any toothpaste or mouthwash.  Use CHG Soap as directed on instruction sheet.  Do not wear jewelry, make-up, hairpins, clips or nail polish.  For welded (permanent) jewelry: bracelets, anklets, waist bands, etc.  Please have this removed prior to surgery.  If it is not removed, there is a chance that hospital personnel will need to cut it off on the day of surgery.  Do not wear lotions, powders, or perfumes.   Do not shave body hair from the neck down 48 hours before surgery.  Contact lenses, hearing aids and dentures may not be worn into surgery.  Do not bring valuables to the hospital. [facility] is not responsible for any missing/lost belongings or valuables.   Notify your doctor if there is  any change in your medical condition (cold, fever, infection).  Wear comfortable clothing (specific to your surgery type) to the hospital.  After surgery, you can help prevent lung complications by doing breathing exercises.  Take deep breaths and cough every 1-2 hours. Your doctor may order a device called an Incentive Spirometer to help you take deep breaths. When coughing or sneezing, hold a pillow firmly  against your incision with both hands. This is called "splinting." Doing this helps protect your incision. It also decreases belly discomfort.  If you are being admitted to the hospital overnight, leave your suitcase in the car. After surgery it may be brought to your room.  In case of increased patient census, it may be necessary for you, the patient, to continue your postoperative care in the Same Day Surgery department.  If you are being discharged the day of surgery, you will not be allowed to drive home. You will need a responsible individual to drive you home and stay with you for 24 hours after surgery.   If you are taking public transportation, you will need to have a responsible individual with you.  Please call the Pre-admissions Testing Dept. at [phone] if you have any questions about these instructions.  Surgery Visitation Policy:  Patients having surgery or a procedure may have two visitors.  Children under the age of 62 must have an adult with them who is not the patient.     Preparing for Surgery with CHLORHEXIDINE GLUCONATE (CHG) Soap  Chlorhexidine Gluconate (CHG) Soap  o An antiseptic cleaner that kills germs and bonds with the skin to continue killing germs even after washing  o Used for showering the night before surgery and morning of surgery  Before surgery, you can play an important role by reducing the number of germs on your skin.  CHG (Chlorhexidine gluconate) soap is an antiseptic cleanser which kills germs and bonds with the skin to continue killing germs even after washing.  Please do not use if you have an allergy to CHG or antibacterial soaps. If your skin becomes reddened/irritated stop using the CHG.  1. Shower the NIGHT BEFORE SURGERY and the MORNING OF SURGERY with CHG soap.  2. If you choose to wash your hair, wash your hair first as usual with your normal shampoo.  3. After shampooing, rinse your hair and body thoroughly to remove the  shampoo.  4. Use CHG as you would any other liquid soap. You can apply CHG directly to the skin and wash gently with a scrungie or a clean washcloth.  5. Apply the CHG soap to your body only from the neck down. Do not use on open wounds or open sores. Avoid contact with your eyes, ears, mouth, and genitals (private parts). Wash face and genitals (private parts) with your normal soap.  6. Wash thoroughly, paying special attention to the area where your surgery will be performed.  7. Thoroughly rinse your body with warm water.  8. Do not shower/wash with your normal soap after using and rinsing off the CHG soap.  9. Pat yourself dry with a clean towel.  10. Wear clean pajamas to bed the night before surgery.  12. Place clean sheets on your bed the night of your first shower and do not sleep with pets.  13. Shower again with the CHG soap on the day of surgery prior to arriving at the hospital.  14. Do not apply any deodorants/lotions/powders.  15. Please wear clean clothes to the  hospital.  How to Use an Incentive Spirometer An incentive spirometer is a tool that measures how well you are filling your lungs with each breath. Learning to take long, deep breaths using this tool can help you keep your lungs clear and active. This may help to reverse or lessen your chance of developing breathing (pulmonary) problems, especially infection. You may be asked to use a spirometer: After a surgery. If you have a lung problem or a history of smoking. After a long period of time when you have been unable to move or be active. If the spirometer includes an indicator to show the highest number that you have reached, your health care provider or respiratory therapist will help you set a goal. Keep a log of your progress as told by your health care provider. What are the risks? Breathing too quickly may cause dizziness or cause you to pass out. Take your time so you do not get dizzy or light-headed. If  you are in pain, you may need to take pain medicine before doing incentive spirometry. It is harder to take a deep breath if you are having pain. How to use your incentive spirometer  Sit up on the edge of your bed or on a chair. Hold the incentive spirometer so that it is in an upright position. Before you use the spirometer, breathe out normally. Place the mouthpiece in your mouth. Make sure your lips are closed tightly around it. Breathe in slowly and as deeply as you can through your mouth, causing the piston or the ball to rise toward the top of the chamber. Hold your breath for 3-5 seconds, or for as long as possible. If the spirometer includes a coach indicator, use this to guide you in breathing. Slow down your breathing if the indicator goes above the marked areas. Remove the mouthpiece from your mouth and breathe out normally. The piston or ball will return to the bottom of the chamber. Rest for a few seconds, then repeat the steps 10 or more times. Take your time and take a few normal breaths between deep breaths so that you do not get dizzy or light-headed. Do this every 1-2 hours when you are awake. If the spirometer includes a goal marker to show the highest number you have reached (best effort), use this as a goal to work toward during each repetition. After each set of 10 deep breaths, cough a few times. This will help to make sure that your lungs are clear. If you have an incision on your chest or abdomen from surgery, place a pillow or a rolled-up towel firmly against the incision when you cough. This can help to reduce pain while taking deep breaths and coughing. General tips When you are able to get out of bed: Walk around often. Continue to take deep breaths and cough in order to clear your lungs. Keep using the incentive spirometer until your health care provider says it is okay to stop using it. If you have been in the hospital, you may be told to keep using the spirometer  at home. Contact a health care provider if: You are having difficulty using the spirometer. You have trouble using the spirometer as often as instructed. Your pain medicine is not giving enough relief for you to use the spirometer as told. You have a fever. Get help right away if: You develop shortness of breath. You develop a cough with bloody mucus from the lungs. You have fluid or blood coming  from an incision site after you cough. Summary An incentive spirometer is a tool that can help you learn to take long, deep breaths to keep your lungs clear and active. You may be asked to use a spirometer after a surgery, if you have a lung problem or a history of smoking, or if you have been inactive for a long period of time. Use your incentive spirometer as instructed every 1-2 hours while you are awake. If you have an incision on your chest or abdomen, place a pillow or a rolled-up towel firmly against your incision when you cough. This will help to reduce pain. Get help right away if you have shortness of breath, you cough up bloody mucus, or blood comes from your incision when you cough. This information is not intended to replace advice given to you by your health care provider. Make sure you discuss any questions you have with your health care provider. Document Revised: 12/25/2019 Document Reviewed: 12/25/2019 Elsevier Patient Education  2024 [employer].

## 2023-08-04 ENCOUNTER — Encounter
Admission: RE | Admit: 2023-08-04 | Discharge: 2023-08-04 | Disposition: A | Discharge status: Home / Self Care | Payer: BC Managed Care – PPO | Source: Ambulatory Visit | Attending: Obstetrics and Gynecology | Admitting: Obstetrics and Gynecology

## 2023-08-04 DIAGNOSIS — N838 Other noninflammatory disorders of ovary, fallopian tube and broad ligament: Secondary | ICD-10-CM | POA: Diagnosis not present

## 2023-08-04 DIAGNOSIS — M7989 Other specified soft tissue disorders: Secondary | ICD-10-CM | POA: Diagnosis not present

## 2023-08-04 DIAGNOSIS — Z01818 Encounter for other preprocedural examination: Secondary | ICD-10-CM | POA: Insufficient documentation

## 2023-08-04 DIAGNOSIS — D271 Benign neoplasm of left ovary: Secondary | ICD-10-CM | POA: Diagnosis not present

## 2023-08-04 DIAGNOSIS — Z7982 Long term (current) use of aspirin: Secondary | ICD-10-CM | POA: Diagnosis not present

## 2023-08-04 DIAGNOSIS — Z79899 Other long term (current) drug therapy: Secondary | ICD-10-CM | POA: Diagnosis not present

## 2023-08-04 DIAGNOSIS — Z01810 Encounter for preprocedural cardiovascular examination: Secondary | ICD-10-CM

## 2023-08-04 DIAGNOSIS — I10 Essential (primary) hypertension: Secondary | ICD-10-CM | POA: Insufficient documentation

## 2023-08-04 DIAGNOSIS — N83202 Unspecified ovarian cyst, left side: Secondary | ICD-10-CM | POA: Diagnosis present

## 2023-08-04 DIAGNOSIS — R7303 Prediabetes: Secondary | ICD-10-CM | POA: Diagnosis not present

## 2023-08-04 LAB — BASIC METABOLIC PANEL
Anion gap: 9 (ref 5–15)
BUN: 11 mg/dL (ref 8–23)
CO2: 27 mmol/L (ref 22–32)
Calcium: 9.6 mg/dL (ref 8.9–10.3)
Chloride: 103 mmol/L (ref 98–111)
Creatinine, Ser: 0.66 mg/dL (ref 0.44–1.00)
GFR, Estimated: >60 mL/min (ref >60)
Glucose, Bld: 100 mg/dL — ABNORMAL HIGH (ref 70–99)
Potassium: 3.5 mmol/L (ref 3.5–5.1)
Sodium: 139 mmol/L (ref 135–145)

## 2023-08-04 LAB — TYPE AND SCREEN
ABO/RH(D): O POS
Antibody Screen: NEGATIVE

## 2023-08-04 LAB — CBC
HCT: 39.1 % (ref 36.0–46.0)
Hemoglobin: 13.1 g/dL (ref 12.0–15.0)
MCH: 29.4 pg (ref 26.0–34.0)
MCHC: 33.5 g/dL (ref 30.0–36.0)
MCV: 87.7 fL (ref 80.0–100.0)
Platelets: 262 10*3/uL (ref 150–400)
RBC: 4.46 MIL/uL (ref 3.87–5.11)
RDW: 12.7 % (ref 11.5–15.5)
WBC: 5.3 10*3/uL (ref 4.0–10.5)
nRBC: 0 % (ref 0.0–0.2)

## 2023-08-06 ENCOUNTER — Other Ambulatory Visit: Payer: Self-pay

## 2023-08-06 ENCOUNTER — Encounter: Payer: Self-pay | Admitting: Obstetrics and Gynecology

## 2023-08-06 ENCOUNTER — Ambulatory Visit: Payer: BC Managed Care – PPO | Admitting: Urgent Care

## 2023-08-06 ENCOUNTER — Ambulatory Visit: Payer: BC Managed Care – PPO | Admitting: Anesthesiology

## 2023-08-06 ENCOUNTER — Ambulatory Visit
Admission: RE | Admit: 2023-08-06 | Discharge: 2023-08-06 | Disposition: A | Discharge status: Home / Self Care | Payer: BC Managed Care – PPO | Attending: Obstetrics and Gynecology | Admitting: Obstetrics and Gynecology

## 2023-08-06 ENCOUNTER — Encounter
Admission: RE | Disposition: A | Discharge status: Home / Self Care | Payer: Self-pay | Source: Home / Self Care | Attending: Obstetrics and Gynecology

## 2023-08-06 DIAGNOSIS — D271 Benign neoplasm of left ovary: Secondary | ICD-10-CM | POA: Insufficient documentation

## 2023-08-06 DIAGNOSIS — M7989 Other specified soft tissue disorders: Secondary | ICD-10-CM | POA: Insufficient documentation

## 2023-08-06 DIAGNOSIS — Z7982 Long term (current) use of aspirin: Secondary | ICD-10-CM | POA: Insufficient documentation

## 2023-08-06 DIAGNOSIS — I10 Essential (primary) hypertension: Secondary | ICD-10-CM | POA: Insufficient documentation

## 2023-08-06 DIAGNOSIS — N838 Other noninflammatory disorders of ovary, fallopian tube and broad ligament: Secondary | ICD-10-CM | POA: Insufficient documentation

## 2023-08-06 DIAGNOSIS — Z01818 Encounter for other preprocedural examination: Secondary | ICD-10-CM

## 2023-08-06 DIAGNOSIS — R7303 Prediabetes: Secondary | ICD-10-CM | POA: Insufficient documentation

## 2023-08-06 DIAGNOSIS — Z79899 Other long term (current) drug therapy: Secondary | ICD-10-CM | POA: Insufficient documentation

## 2023-08-06 HISTORY — PX: LAPAROSCOPIC BILATERAL SALPINGO OOPHERECTOMY: SHX5890

## 2023-08-06 LAB — ABO/RH: ABO/RH(D): O POS

## 2023-08-06 SURGERY — SALPINGO-OOPHORECTOMY, BILATERAL, LAPAROSCOPIC
Anesthesia: General | Site: Abdomen | Laterality: Bilateral

## 2023-08-06 MED ORDER — BUPIVACAINE HCL 0.5 % IJ SOLN
INTRAMUSCULAR | Status: DC | PRN
  Administered 2023-08-06: 6 mL

## 2023-08-06 MED ORDER — 0.9 % SODIUM CHLORIDE (POUR BTL) OPTIME
TOPICAL | Status: DC | PRN
  Administered 2023-08-06: 500 mL

## 2023-08-06 MED ORDER — PHENYLEPHRINE 80 MCG/ML (10ML) SYRINGE FOR IV PUSH (FOR BLOOD PRESSURE SUPPORT)
PREFILLED_SYRINGE | INTRAVENOUS | Status: AC
  Filled 2023-08-06: qty 10

## 2023-08-06 MED ORDER — ORAL CARE MOUTH RINSE: 15.0000 mL | Freq: Once | OROMUCOSAL | Status: AC

## 2023-08-06 MED ORDER — MIDAZOLAM HCL 2 MG/2ML IJ SOLN
INTRAMUSCULAR | Status: DC | PRN
  Administered 2023-08-06: 1 mg via INTRAVENOUS

## 2023-08-06 MED ORDER — EPHEDRINE 5 MG/ML INJ
INTRAVENOUS | Status: AC
  Filled 2023-08-06: qty 5

## 2023-08-06 MED ORDER — ROCURONIUM BROMIDE 10 MG/ML (PF) SYRINGE
PREFILLED_SYRINGE | INTRAVENOUS | Status: AC
  Filled 2023-08-06: qty 10

## 2023-08-06 MED ORDER — LACTATED RINGERS IV SOLN: 125.0000 mL/h | INTRAVENOUS | Status: DC

## 2023-08-06 MED ORDER — PROPOFOL 10 MG/ML IV BOLUS
INTRAVENOUS | Status: AC
  Filled 2023-08-06: qty 20

## 2023-08-06 MED ORDER — SILVER NITRATE-POT NITRATE 75-25 % EX MISC
CUTANEOUS | Status: AC
  Filled 2023-08-06: qty 10

## 2023-08-06 MED ORDER — FENTANYL CITRATE (PF) 100 MCG/2ML IJ SOLN
INTRAMUSCULAR | Status: DC | PRN
  Administered 2023-08-06 (×2): 50 ug via INTRAVENOUS

## 2023-08-06 MED ORDER — LIDOCAINE HCL (PF) 2 % IJ SOLN
INTRAMUSCULAR | Status: AC
  Filled 2023-08-06: qty 5

## 2023-08-06 MED ORDER — ONDANSETRON HCL 4 MG/2ML IJ SOLN
INTRAMUSCULAR | Status: AC
  Filled 2023-08-06: qty 2

## 2023-08-06 MED ORDER — CHLORHEXIDINE GLUCONATE 0.12 % MT SOLN
OROMUCOSAL | Status: AC
  Filled 2023-08-06: qty 15

## 2023-08-06 MED ORDER — KETOROLAC TROMETHAMINE 30 MG/ML IJ SOLN
INTRAMUSCULAR | Status: DC | PRN
  Administered 2023-08-06: 30 mg via INTRAVENOUS

## 2023-08-06 MED ORDER — SUGAMMADEX SODIUM 200 MG/2ML IV SOLN
INTRAVENOUS | Status: DC | PRN
  Administered 2023-08-06: 150 mg via INTRAVENOUS

## 2023-08-06 MED ORDER — PROPOFOL 10 MG/ML IV BOLUS
INTRAVENOUS | Status: DC | PRN
  Administered 2023-08-06: 100 mg via INTRAVENOUS

## 2023-08-06 MED ORDER — LACTATED RINGERS IV SOLN: INTRAVENOUS | Status: DC

## 2023-08-06 MED ORDER — ONDANSETRON HCL 4 MG/2ML IJ SOLN
INTRAMUSCULAR | Status: DC | PRN
  Administered 2023-08-06: 4 mg via INTRAVENOUS

## 2023-08-06 MED ORDER — GABAPENTIN 300 MG PO CAPS
300.0000 mg | ORAL_CAPSULE | ORAL | Status: AC
  Administered 2023-08-06: 300 mg via ORAL

## 2023-08-06 MED ORDER — ACETAMINOPHEN 500 MG PO TABS
1000.0000 mg | ORAL_TABLET | ORAL | Status: AC
  Administered 2023-08-06: 1000 mg via ORAL

## 2023-08-06 MED ORDER — CHLORHEXIDINE GLUCONATE 0.12 % MT SOLN
15.0000 mL | Freq: Once | OROMUCOSAL | Status: AC
  Administered 2023-08-06: 15 mL via OROMUCOSAL

## 2023-08-06 MED ORDER — FENTANYL CITRATE (PF) 100 MCG/2ML IJ SOLN
INTRAMUSCULAR | Status: AC
  Filled 2023-08-06: qty 2

## 2023-08-06 MED ORDER — ACETAMINOPHEN 500 MG PO TABS
ORAL_TABLET | ORAL | Status: AC
  Filled 2023-08-06: qty 2

## 2023-08-06 MED ORDER — DEXAMETHASONE SODIUM PHOSPHATE 10 MG/ML IJ SOLN
INTRAMUSCULAR | Status: DC | PRN
  Administered 2023-08-06: 10 mg via INTRAVENOUS

## 2023-08-06 MED ORDER — ONDANSETRON HCL 4 MG/2ML IJ SOLN: 4.0000 mg | Freq: Once | INTRAMUSCULAR | Status: DC | PRN

## 2023-08-06 MED ORDER — BUPIVACAINE HCL (PF) 0.5 % IJ SOLN
INTRAMUSCULAR | Status: AC
  Filled 2023-08-06: qty 30

## 2023-08-06 MED ORDER — GABAPENTIN 300 MG PO CAPS
ORAL_CAPSULE | ORAL | Status: AC
  Filled 2023-08-06: qty 1

## 2023-08-06 MED ORDER — DEXAMETHASONE SODIUM PHOSPHATE 10 MG/ML IJ SOLN
INTRAMUSCULAR | Status: AC
  Filled 2023-08-06: qty 1

## 2023-08-06 MED ORDER — OXYCODONE-ACETAMINOPHEN 5-325 MG PO TABS: 1.0000 | ORAL_TABLET | ORAL | Status: DC | PRN

## 2023-08-06 MED ORDER — KETOROLAC TROMETHAMINE 30 MG/ML IJ SOLN
INTRAMUSCULAR | Status: AC
  Filled 2023-08-06: qty 1

## 2023-08-06 MED ORDER — POVIDONE-IODINE 10 % EX SWAB
2.0000 | Freq: Once | CUTANEOUS | Status: AC
  Administered 2023-08-06: 2 via TOPICAL

## 2023-08-06 MED ORDER — EPHEDRINE SULFATE-NACL 50-0.9 MG/10ML-% IV SOSY
PREFILLED_SYRINGE | INTRAVENOUS | Status: DC | PRN
  Administered 2023-08-06: 5 mg via INTRAVENOUS
  Administered 2023-08-06 (×2): 10 mg via INTRAVENOUS

## 2023-08-06 MED ORDER — ROCURONIUM BROMIDE 100 MG/10ML IV SOLN
INTRAVENOUS | Status: DC | PRN
  Administered 2023-08-06: 50 mg via INTRAVENOUS

## 2023-08-06 MED ORDER — LIDOCAINE HCL (CARDIAC) PF 100 MG/5ML IV SOSY
PREFILLED_SYRINGE | INTRAVENOUS | Status: DC | PRN
  Administered 2023-08-06: 80 mg via INTRAVENOUS

## 2023-08-06 MED ORDER — MIDAZOLAM HCL 2 MG/2ML IJ SOLN
INTRAMUSCULAR | Status: AC
  Filled 2023-08-06: qty 2

## 2023-08-06 MED ORDER — LACTATED RINGERS IV SOLN: INTRAVENOUS | Status: DC | PRN

## 2023-08-06 MED ORDER — PHENYLEPHRINE 80 MCG/ML (10ML) SYRINGE FOR IV PUSH (FOR BLOOD PRESSURE SUPPORT)
PREFILLED_SYRINGE | INTRAVENOUS | Status: DC | PRN
  Administered 2023-08-06: 80 ug via INTRAVENOUS
  Administered 2023-08-06 (×3): 160 ug via INTRAVENOUS

## 2023-08-06 MED ORDER — ONDANSETRON 4 MG PO TBDP: 4.0000 mg | ORAL_TABLET | Freq: Four times a day (QID) | ORAL | Status: DC | PRN

## 2023-08-06 MED ORDER — FENTANYL CITRATE (PF) 100 MCG/2ML IJ SOLN: 25.0000 ug | INTRAMUSCULAR | Status: DC | PRN

## 2023-08-06 SURGICAL SUPPLY — 54 items
APL PRP STRL LF DISP 70% ISPRP (MISCELLANEOUS) ×1
BAG DRN RND TRDRP ANRFLXCHMBR (UROLOGICAL SUPPLIES)
BAG URINE DRAIN 2000ML AR STRL (UROLOGICAL SUPPLIES) IMPLANT
BLADE SURG SZ11 CARB STEEL (BLADE) ×1 IMPLANT
CATH FOLEY 2WAY 5CC 16FR (CATHETERS) ×1
CATH ROBINSON RED A/P 16FR (CATHETERS) ×1 IMPLANT
CATH URTH 16FR FL 2W BLN LF (CATHETERS) ×1 IMPLANT
CHLORAPREP W/TINT 26 (MISCELLANEOUS) ×1 IMPLANT
DRSG TEGADERM 2-3/8X2-3/4 SM (GAUZE/BANDAGES/DRESSINGS) ×1 IMPLANT
GAUZE 4X4 16PLY ~~LOC~~+RFID DBL (SPONGE) ×1 IMPLANT
GAUZE SPONGE 2X2 STRL 8-PLY (GAUZE/BANDAGES/DRESSINGS) ×1 IMPLANT
GLOVE BIOGEL PI IND STRL 7.5 (GLOVE) ×1 IMPLANT
GLOVE SURG SYN 7.0 (GLOVE) ×1 IMPLANT
GLOVE SURG SYN 7.0 PF PI (GLOVE) ×1 IMPLANT
GLOVE SURG SYN 8.0 (GLOVE) ×2 IMPLANT
GLOVE SURG SYN 8.0 PF PI (GLOVE) ×2 IMPLANT
GOWN STRL REUS W/ TWL LRG LVL3 (GOWN DISPOSABLE) ×1 IMPLANT
GOWN STRL REUS W/ TWL XL LVL3 (GOWN DISPOSABLE) ×2 IMPLANT
GOWN STRL REUS W/TWL LRG LVL3 (GOWN DISPOSABLE) ×1
GOWN STRL REUS W/TWL XL LVL3 (GOWN DISPOSABLE) ×2
GRASPER SUT TROCAR 14GX15 (MISCELLANEOUS) ×1 IMPLANT
IRRIGATION STRYKERFLOW (MISCELLANEOUS) ×1 IMPLANT
IRRIGATOR STRYKERFLOW (MISCELLANEOUS)
IV NS 1000ML (IV SOLUTION) ×1
IV NS 1000ML BAXH (IV SOLUTION) ×1 IMPLANT
KIT PINK PAD W/HEAD ARE REST (MISCELLANEOUS) ×1
KIT PINK PAD W/HEAD ARM REST (MISCELLANEOUS) ×1 IMPLANT
KIT TURNOVER CYSTO (KITS) ×1 IMPLANT
KITTNER LAPARASCOPIC 5X40 (MISCELLANEOUS) IMPLANT
LABEL OR SOLS (LABEL) ×1 IMPLANT
MANIFOLD NEPTUNE II (INSTRUMENTS) ×1 IMPLANT
NS IRRIG 500ML POUR BTL (IV SOLUTION) ×1 IMPLANT
PACK GYN LAPAROSCOPIC (MISCELLANEOUS) ×1 IMPLANT
PAD OB MATERNITY 4.3X12.25 (PERSONAL CARE ITEMS) ×1 IMPLANT
PAD PREP OB/GYN DISP 24X41 (PERSONAL CARE ITEMS) ×1 IMPLANT
SCISSORS METZENBAUM CVD 33 (INSTRUMENTS) IMPLANT
SCRUB CHG 4% DYNA-HEX 4OZ (MISCELLANEOUS) ×1 IMPLANT
SET TUBE SMOKE EVAC HIGH FLOW (TUBING) ×1 IMPLANT
SHEARS HARMONIC 36 ACE (MISCELLANEOUS) ×1 IMPLANT
SLEEVE Z-THREAD 5X100MM (TROCAR) ×1 IMPLANT
STRIP CLOSURE SKIN 1/4X4 (GAUZE/BANDAGES/DRESSINGS) ×1 IMPLANT
SUT VIC AB 0 CT1 36 (SUTURE) ×1 IMPLANT
SUT VIC AB 2-0 UR6 27 (SUTURE) ×1 IMPLANT
SUT VIC AB 4-0 FS2 27 (SUTURE) IMPLANT
SUT VIC AB 4-0 SH 27 (SUTURE) ×1
SUT VIC AB 4-0 SH 27XANBCTRL (SUTURE) ×1 IMPLANT
SWABSTK COMLB BENZOIN TINCTURE (MISCELLANEOUS) ×1 IMPLANT
SYR 50ML LL SCALE MARK (SYRINGE) IMPLANT
SYS BAG RETRIEVAL 10MM (BASKET) ×1
SYSTEM BAG RETRIEVAL 10MM (BASKET) ×1 IMPLANT
TRAP FLUID SMOKE EVACUATOR (MISCELLANEOUS) ×1 IMPLANT
TROCAR Z-THRD FIOS HNDL 11X100 (TROCAR) ×1 IMPLANT
TROCAR Z-THREAD FIOS 5X100MM (TROCAR) ×1 IMPLANT
WATER STERILE IRR 500ML POUR (IV SOLUTION) ×1 IMPLANT

## 2023-08-06 NOTE — Discharge Instructions (Signed)

## 2023-08-06 NOTE — Brief Op Note (Signed)
08/06/2023  9:00 AM  PATIENT:  Christy [name]  62 y.o. female  PRE-OPERATIVE DIAGNOSIS:  left ovarian cyst- dermoid  POST-OPERATIVE DIAGNOSIS:  left ovarian cyst- dermoid  PROCEDURE:  Procedure(s): LAPAROSCOPIC BILATERAL SALPINGO OOPHORECTOMY (Bilateral)  SURGEON:  Surgeons and Role:    * Latiffany Harwick, Ihor Austin, MD - Primary    * Christeen Douglas, MD - Assisting  PHYSICIAN ASSISTANT:   ASSISTANTS: none   ANESTHESIA:   general  EBL:  15 cc , IOF 400cc , uo 100  BLOOD ADMINISTERED:none  DRAINS: none   LOCAL MEDICATIONS USED:  MARCAINE     SPECIMEN:  Source of Specimen:  bilateral tubes and ovaries   DISPOSITION OF SPECIMEN:  PATHOLOGY  COUNTS:  YES  TOURNIQUET:  * No tourniquets in log *  DICTATION: .Other Dictation: Dictation Number verbal  PLAN OF CARE: Discharge to home after PACU  PATIENT DISPOSITION:  PACU - hemodynamically stable.   Delay start of Pharmacological VTE agent (>24hrs) due to surgical blood loss or risk of bleeding: not applicable

## 2023-08-06 NOTE — Progress Notes (Signed)
Pt ready for L/S BSO  complex left cyst , possible dermoid  Labs reviewed . All questions answered . Proceed

## 2023-08-06 NOTE — Anesthesia Preprocedure Evaluation (Signed)
Anesthesia Evaluation  Patient identified by MRN, date of birth, ID band Patient awake    Reviewed: Allergy & Precautions, NPO status , Patient's Chart, lab work & pertinent test results  Airway Mallampati: III  TM Distance: <3 FB Neck ROM: Full    Dental  (+) Teeth Intact   Pulmonary neg pulmonary ROS   Pulmonary exam normal breath sounds clear to auscultation       Cardiovascular Exercise Tolerance: Good hypertension, Pt. on medications negative cardio ROS Normal cardiovascular exam Rhythm:Regular Rate:Normal     Neuro/Psych negative neurological ROS  negative psych ROS   GI/Hepatic negative GI ROS, Neg liver ROS,,,  Endo/Other  negative endocrine ROS    Renal/GU negative Renal ROS  negative genitourinary   Musculoskeletal   Abdominal Normal abdominal exam  (+)   Peds negative pediatric ROS (+)  Hematology negative hematology ROS (+)   Anesthesia Other Findings Past Medical History: No date: Hypertension No date: Ovarian cyst, left No date: Pre-diabetes  Past Surgical History: No date: CESAREAN SECTION  BMI    Body Mass Index: 20.59 kg/m      Reproductive/Obstetrics negative OB ROS                             Anesthesia Physical Anesthesia Plan  ASA: 2  Anesthesia Plan: General   Post-op Pain Management:    Induction: Intravenous  PONV Risk Score and Plan: 1 and Ondansetron and Dexamethasone  Airway Management Planned: Oral ETT  Additional Equipment:   Intra-op Plan:   Post-operative Plan: Extubation in OR  Informed Consent: I have reviewed the patients History and Physical, chart, labs and discussed the procedure including the risks, benefits and alternatives for the proposed anesthesia with the patient or authorized representative who has indicated his/her understanding and acceptance.     Dental Advisory Given  Plan Discussed with: CRNA and  Surgeon  Anesthesia Plan Comments:        Anesthesia Quick Evaluation

## 2023-08-06 NOTE — Transfer of Care (Signed)
Immediate Anesthesia Transfer of Care Note  Patient: Christy [name]  Procedure(s) Performed: LAPAROSCOPIC BILATERAL SALPINGO OOPHORECTOMY (Bilateral: Abdomen)  Patient Location: PACU  Anesthesia Type:General  Level of Consciousness: drowsy and patient cooperative  Airway & Oxygen Therapy: Patient Spontanous Breathing and Patient connected to face mask oxygen  Post-op Assessment: Report given to RN and Patient moving all extremities  Post vital signs: Reviewed and stable  Last Vitals:  Vitals Value Taken Time  BP 99/63 08/06/23 0910  Temp 36.3 C 08/06/23 0910  Pulse 69 08/06/23 0910  Resp 13 08/06/23 0910  SpO2 100 % 08/06/23 0910  Vitals shown include unfiled device data.  Last Pain:  Vitals:   08/06/23 0910  TempSrc: Tympanic  PainSc: 0-No pain         Complications: No notable events documented.

## 2023-08-06 NOTE — Op Note (Signed)
NAME: [name], Christy K. MEDICAL RECORD NO: [id number] ACCOUNT NO: [id number] DATE OF BIRTH: 1961-01-08 FACILITY: [facility] LOCATION: [facility] PHYSICIAN: Suzy Bouchard, MD  Operative Report   DATE OF PROCEDURE: 08/06/2023  PREOPERATIVE DIAGNOSIS:  Complex left ovarian cyst.  POSTOPERATIVE DIAGNOSIS:  Complex left ovarian cyst.  PROCEDURE:  Laparoscopic bilateral salpingo-oophorectomy.  SURGEON:  Suzy Bouchard, MD  FIRST ASSISTANT:  Christeen Douglas, MD  ANESTHESIA:  General endotracheal anesthesia.  INDICATIONS:  This is a 62 year old female with a finding of a complex left ovarian cyst consistent with a dermoid cyst.  DESCRIPTION OF PROCEDURE:  After adequate general endotracheal anesthesia, the patient was placed in dorsal supine position with legs in the Allen stirrups.  The patient's abdomen, perineum and vagina were prepped and draped in normal sterile fashion.   Timeout was performed.  Sponge stick was placed in the vagina to be used for uterine manipulation during the procedure and a Foley catheter was placed to drain the bladder during the procedure.  Gloves and gown were changed.  Attention was directed to  the patient's abdomen where a 5 mm infraumbilical incision was made.  5 mm laparoscope was advanced into the abdominal cavity under direct visualization.  Upon entry, patient's abdomen was insufflated.  A second port placement was placed left lower  quadrant 3 cm medial to the left anterior iliac spine and #11 trocar was advanced under direct visualization.  Third port was placed in the right lower quadrant, again 3 cm medial to the right anterior iliac spine, a 5 mm trocar was advanced.  The  patient was placed in Trendelenburg.  Attention was directed to the patient's left adnexa. After visualizing the ureter, the infundibulopelvic ligament was cauterized, transected and the ovary and fallopian tube were removed and placed in the cul-de-sac  while the  similar procedure was repeated on the patient's right side.  Again, the right ureter was identified with normal peristaltic activity.  The infundibulopelvic ligament was cauterized, transected and the right ovary and fallopian tube were removed  and then both ovaries and tubes were placed into an Endobag and retrieved through the left port site.  Good hemostasis was noted.  Intraoperative pictures were taken.  Pressure was lowered to 7 mmHg.  Good hemostasis noted.  The Carter-Thomason cone and  PMI apparatus were then used to close the fascia on the left port site.  The patient's abdomen was deflated and all skin incisions were closed with interrupted 4-0 Vicryl suture.  Foley catheter removed as well as the sponge stick.  There were no  complications.  ESTIMATED BLOOD LOSS:  15 mL  INTRAOPERATIVE FLUIDS:  400 mL  URINE OUTPUT:  100 mL  The patient did receive 30 mg intravenous Toradol at the end of the procedure and was taken to recovery room in good condition.       PAA D: 08/06/2023 9:09:36 am T: 08/06/2023 9:48:00 am  JOB: [id number]/ [id number]

## 2023-08-06 NOTE — Anesthesia Procedure Notes (Signed)
Procedure Name: Intubation Date/Time: 08/06/2023 7:51 AM  Performed by: Lily Lovings, CRNAPre-anesthesia Checklist: Patient identified, Patient being monitored, Timeout performed, Emergency Drugs available and Suction available Patient Re-evaluated:Patient Re-evaluated prior to induction Oxygen Delivery Method: Circle system utilized Preoxygenation: Pre-oxygenation with 100% oxygen Induction Type: IV induction Ventilation: Mask ventilation without difficulty Laryngoscope Size: 3 and McGraph Grade View: Grade I Tube type: Oral Tube size: 6.5 mm Number of attempts: 1 Airway Equipment and Method: Stylet Placement Confirmation: ETT inserted through vocal cords under direct vision, positive ETCO2 and breath sounds checked- equal and bilateral Secured at: 21 cm Tube secured with: Tape Dental Injury: Teeth and Oropharynx as per pre-operative assessment  Comments: Soft bite block placed

## 2023-08-06 NOTE — Anesthesia Postprocedure Evaluation (Signed)
Anesthesia Post Note  Patient: Christy [name]  Procedure(s) Performed: LAPAROSCOPIC BILATERAL SALPINGO OOPHORECTOMY (Bilateral: Abdomen)  Patient location during evaluation: PACU Anesthesia Type: General Level of consciousness: awake and awake and alert Pain management: pain level controlled Vital Signs Assessment: post-procedure vital signs reviewed and stable Respiratory status: spontaneous breathing and nonlabored ventilation Cardiovascular status: blood pressure returned to baseline Anesthetic complications: no   No notable events documented.   Last Vitals:  Vitals:   08/06/23 0945 08/06/23 1000  BP: 106/67 117/73  Pulse: 67 64  Resp: 13 16  Temp: 36.5 C (!) 36.1 C  SpO2: 100% 99%    Last Pain:  Vitals:   08/06/23 1000  TempSrc:   PainSc: 0-No pain                 VAN STAVEREN,Zollie Clemence

## 2023-08-08 NOTE — Plan of Care (Signed)
[facility] Tonsillectomy/Adenoidectomy, Postoperative PEDS care plan entered in error.

## 2023-08-09 LAB — SURGICAL PATHOLOGY

## 2023-08-17 ENCOUNTER — Other Ambulatory Visit: Payer: Self-pay | Admitting: Sports Medicine

## 2023-08-17 DIAGNOSIS — M7989 Other specified soft tissue disorders: Secondary | ICD-10-CM

## 2023-09-03 ENCOUNTER — Ambulatory Visit
Admission: RE | Admit: 2023-09-03 | Discharge: 2023-09-03 | Disposition: A | Discharge status: Home / Self Care | Payer: No Typology Code available for payment source | Source: Ambulatory Visit | Attending: Sports Medicine

## 2023-09-03 DIAGNOSIS — M7989 Other specified soft tissue disorders: Secondary | ICD-10-CM

## 2023-09-03 MED ORDER — GADOPICLENOL 0.5 MMOL/ML IV SOLN
7.5000 mL | Freq: Once | INTRAVENOUS | Status: AC | PRN
  Administered 2023-09-03: 6 mL via INTRAVENOUS

## 2023-09-29 ENCOUNTER — Ambulatory Visit
Admission: RE | Admit: 2023-09-29 | Discharge: 2023-09-29 | Disposition: A | Discharge status: Home / Self Care | Payer: BC Managed Care – PPO | Source: Ambulatory Visit | Attending: Physician Assistant | Admitting: Physician Assistant

## 2023-09-29 DIAGNOSIS — Z1231 Encounter for screening mammogram for malignant neoplasm of breast: Secondary | ICD-10-CM | POA: Diagnosis present

## 2024-04-08 ENCOUNTER — Other Ambulatory Visit: Payer: Self-pay

## 2024-04-08 ENCOUNTER — Emergency Department
Admission: EM | Admit: 2024-04-08 | Discharge: 2024-04-08 | Disposition: A | Discharge status: Home / Self Care | Attending: Emergency Medicine | Admitting: Emergency Medicine

## 2024-04-08 DIAGNOSIS — G44209 Tension-type headache, unspecified, not intractable: Secondary | ICD-10-CM | POA: Insufficient documentation

## 2024-04-08 DIAGNOSIS — I10 Essential (primary) hypertension: Secondary | ICD-10-CM | POA: Insufficient documentation

## 2024-04-08 DIAGNOSIS — R519 Headache, unspecified: Secondary | ICD-10-CM | POA: Diagnosis present

## 2024-04-08 DIAGNOSIS — M542 Cervicalgia: Secondary | ICD-10-CM | POA: Diagnosis not present

## 2024-04-08 NOTE — Discharge Instructions (Signed)
 You can take 650 mg of Tylenol  and 600 mg of ibuprofen every 6 hours as needed for pain. You can use ice, heat, muscle creams and other topical pain relievers as well.  Please follow-up with your primary care provider if these headaches continue.  You can return to the emergency department with any worsening symptoms.  We elected to hold off on imaging today given the reassuring findings on exam, if you have any acute worsening please come back to the emergency department and we can get imaging at that time.

## 2024-04-08 NOTE — ED Triage Notes (Signed)
 Pt to ED for L sided HA and L neck pain radiating to shoulder and arm since about 2 days. Pain comes and goes. Denies vision changes.

## 2024-04-08 NOTE — ED Provider Notes (Signed)
 [facility] Provider Note    Event Date/Time   First MD Initiated Contact with Patient 04/08/24 1311     (approximate)   History   Headache and Neck Pain   HPI  Christy [name] is a 63 y.o. female with PMH hypertension, prediabetes and left ovarian cyst presents for evaluation of intermittent head and neck pain.  Patient states that she has had an intermittent headache this for 2 days.  She takes Tylenol  and the headache will go away but then it comes back.  She has also had some left-sided neck pain associated with a headache and some pain down her arm.  She denies vision changes, nausea, numbness or tingling, one-sided weakness.      Physical Exam   Triage Vital Signs: ED Triage Vitals  Encounter Vitals Group     BP 04/08/24 1256 (!) 144/89     Girls Systolic BP Percentile --      Girls Diastolic BP Percentile --      Boys Systolic BP Percentile --      Boys Diastolic BP Percentile --      Pulse Rate 04/08/24 1256 94     Resp 04/08/24 1256 16     Temp 04/08/24 1256 98.4 F (36.9 C)     Temp Source 04/08/24 1256 Oral     SpO2 04/08/24 1256 100 %     Weight 04/08/24 1255 119 lb (54 kg)     Height 04/08/24 1255 5' 1 (1.549 m)     Head Circumference --      Peak Flow --      Pain Score 04/08/24 1255 5     Pain Loc --      Pain Education --      Exclude from Growth Chart --     Most recent vital signs: Vitals:   04/08/24 1256  BP: (!) 144/89  Pulse: 94  Resp: 16  Temp: 98.4 F (36.9 C)  SpO2: 100%   General: Awake, no distress.  CV:  Good peripheral perfusion.  RRR. Resp:  Normal effort.  CTAB. Abd:  No distention.  Other:  PERRL, EOM intact, no ataxia, no pronator drift, no focal neurodeficits.  No tenderness to palpation over the cervical vertebrae or paraspinal muscles.  Radial pulses 2+ and regular.  Sensation well-maintained in bilateral upper extremities.  5/5 strength in bilateral upper extremities.   ED Results /  Procedures / Treatments   Labs (all labs ordered are listed, but only abnormal results are displayed) Labs Reviewed - No data to display   PROCEDURES:  Critical Care performed: No  Procedures   MEDICATIONS ORDERED IN ED: Medications - No data to display   IMPRESSION / MDM / ASSESSMENT AND PLAN / ED COURSE  I reviewed the triage vital signs and the nursing notes.                             63 year old female presents for evaluation of left-sided headache and neck pain.  Blood pressure slightly elevated today otherwise vital signs are stable.  Patient NAD on exam.  Differential diagnosis includes, but is not limited to, tension headache, muscle strain, cervical radiculopathy, migraine.  Patient's presentation is most consistent with acute, uncomplicated illness.  Patient does not have a headache at this time and there are no focal neurodeficits.  Patient did not describe any red flag symptoms consistent with a stroke.  At this  time do not feel imaging is indicated given reassuring findings on exam, however I did offer CT scan of the head and neck out of an abundance of caution.  Patient declined imaging at this time.  We discussed following up with her primary care provider.  We reviewed return precautions.  She voiced understanding, all questions were answered and she was stable at discharge.     FINAL CLINICAL IMPRESSION(S) / ED DIAGNOSES   Final diagnoses:  Acute non intractable tension-type headache     Rx / DC Orders   ED Discharge Orders     None        Note:  This document was prepared using Dragon voice recognition software and may include unintentional dictation errors.   Cleaster Tinnie LABOR, PA-C 04/08/24 1447    Dorothyann Drivers, MD 04/08/24 DANIAL

## 2024-07-20 ENCOUNTER — Other Ambulatory Visit: Payer: Self-pay | Admitting: Physician Assistant

## 2024-07-20 DIAGNOSIS — Z1231 Encounter for screening mammogram for malignant neoplasm of breast: Secondary | ICD-10-CM

## 2024-09-29 ENCOUNTER — Ambulatory Visit
Admission: RE | Admit: 2024-09-29 | Discharge: 2024-09-29 | Disposition: A | Source: Ambulatory Visit | Attending: Physician Assistant | Admitting: Physician Assistant

## 2024-09-29 DIAGNOSIS — Z1231 Encounter for screening mammogram for malignant neoplasm of breast: Secondary | ICD-10-CM
